# Patient Record
Sex: Female | Born: 1990 | Race: White | Hispanic: No | Marital: Married
Health system: Midwestern US, Academic
[De-identification: ages and names within clinical notes are randomized; demographics above are authoritative.]

---

## 2017-04-11 ENCOUNTER — Ambulatory Visit: Admit: 2017-04-11 | Discharge: 2017-04-12 | Payer: MEDICAID

## 2017-04-11 DIAGNOSIS — O24419 Gestational diabetes mellitus in pregnancy, unspecified control: Principal | ICD-10-CM

## 2017-04-12 ENCOUNTER — Encounter: Admit: 2017-04-12 | Discharge: 2017-04-12

## 2017-04-12 NOTE — Progress Notes
Pt emailed blood sugar readings. Reviewed by Dr. Ann MakiParrish.    Current regimen:    Lantus   55 u q hs    Novolog   20 u with breakfast   20 u with lunch   20 u with dinner    New regimen:      Lantus   60 u q hs    Novolog   20 u with breakfast   20 u with lunch   20 u with dinner    Pt emailed and informed of changes.     GA:  [id number]

## 2017-04-18 ENCOUNTER — Ambulatory Visit: Admit: 2017-04-18 | Discharge: 2017-04-19 | Payer: MEDICAID

## 2017-04-18 DIAGNOSIS — O24419 Gestational diabetes mellitus in pregnancy, unspecified control: Principal | ICD-10-CM

## 2017-04-18 NOTE — Progress Notes
Yvonne OppenheimJulia A Hernandez presents for an ultrasound encounter. Past Medical, Surgical, Family & Social History; Medications & Allergies contained in the electronic record below were not reviewed today and may not be up-to-date. Please see [facility] report for all documentation related to this encounter.    04/18/2017  Yvonne PennyAdriana Jimenez, MA

## 2017-04-26 ENCOUNTER — Encounter: Admit: 2017-04-26 | Discharge: 2017-04-26

## 2017-05-23 NOTE — Progress Notes
Georgeann OppenheimJulia A Hanahan presents for an ultrasound encounter. Past Medical, Surgical, Family & Social History; Medications & Allergies contained in the electronic record below were not reviewed today and may not be up-to-date. Please see [facility] report for all documentation related to this encounter.    05/23/2017  Butch PennyAdriana Jimenez, MA

## 2017-11-20 LAB — CHLAM/NG PCR SWAB
Lab: NEGATIVE
Lab: NEGATIVE

## 2017-12-10 ENCOUNTER — Ambulatory Visit: Admit: 2017-12-10 | Discharge: 2017-12-11 | Payer: Medicaid Other

## 2017-12-10 ENCOUNTER — Encounter: Admit: 2017-12-10 | Discharge: 2017-12-10

## 2017-12-10 DIAGNOSIS — O0991 Supervision of high risk pregnancy, unspecified, first trimester: Principal | ICD-10-CM

## 2017-12-10 DIAGNOSIS — Z3682 Encounter for antenatal screening for nuchal translucency: Principal | ICD-10-CM

## 2017-12-18 ENCOUNTER — Encounter: Admit: 2017-12-18 | Discharge: 2017-12-18

## 2017-12-19 ENCOUNTER — Encounter: Admit: 2017-12-19 | Discharge: 2017-12-19

## 2017-12-19 DIAGNOSIS — Z1379 Encounter for other screening for genetic and chromosomal anomalies: Principal | ICD-10-CM

## 2017-12-26 ENCOUNTER — Encounter: Admit: 2017-12-26 | Discharge: 2017-12-26

## 2017-12-26 DIAGNOSIS — Z1379 Encounter for other screening for genetic and chromosomal anomalies: Principal | ICD-10-CM

## 2018-02-06 ENCOUNTER — Encounter: Admit: 2018-02-06 | Discharge: 2018-02-06

## 2018-02-19 ENCOUNTER — Encounter: Admit: 2018-02-19 | Discharge: 2018-02-19

## 2018-02-27 ENCOUNTER — Ambulatory Visit: Admit: 2018-02-27 | Discharge: 2018-02-28 | Payer: Medicaid Other

## 2018-02-27 ENCOUNTER — Encounter: Admit: 2018-02-27 | Discharge: 2018-02-27

## 2018-02-27 DIAGNOSIS — O24419 Gestational diabetes mellitus in pregnancy, unspecified control: Principal | ICD-10-CM

## 2018-02-27 DIAGNOSIS — O0990 Supervision of high risk pregnancy, unspecified, unspecified trimester: Principal | ICD-10-CM

## 2018-02-27 DIAGNOSIS — O09293 Supervision of pregnancy with other poor reproductive or obstetric history, third trimester: ICD-10-CM

## 2018-02-27 DIAGNOSIS — Z8279 Family history of other congenital malformations, deformations and chromosomal abnormalities: ICD-10-CM

## 2018-02-27 MED ORDER — METFORMIN 1,000 MG PO TAB
1000 mg | ORAL_TABLET | Freq: Two times a day (BID) | ORAL | 3 refills | Status: AC
Start: 2018-02-27 — End: 2018-05-28

## 2018-03-06 ENCOUNTER — Encounter: Admit: 2018-03-06 | Discharge: 2018-03-06

## 2018-03-25 ENCOUNTER — Encounter: Admit: 2018-03-25 | Discharge: 2018-03-25

## 2018-03-25 ENCOUNTER — Ambulatory Visit: Admit: 2018-03-25 | Discharge: 2018-03-26 | Payer: Medicaid Other

## 2018-03-25 DIAGNOSIS — Z3A27 27 weeks gestation of pregnancy: ICD-10-CM

## 2018-03-25 DIAGNOSIS — O24414 Gestational diabetes mellitus in pregnancy, insulin controlled: ICD-10-CM

## 2018-03-25 DIAGNOSIS — A6000 Herpesviral infection of urogenital system, unspecified: ICD-10-CM

## 2018-03-25 DIAGNOSIS — O09522 Supervision of elderly multigravida, second trimester: ICD-10-CM

## 2018-03-25 DIAGNOSIS — O0992 Supervision of high risk pregnancy, unspecified, second trimester: Principal | ICD-10-CM

## 2018-04-01 ENCOUNTER — Ambulatory Visit: Admit: 2018-04-01 | Discharge: 2018-04-02 | Payer: Medicaid Other

## 2018-04-01 ENCOUNTER — Encounter: Admit: 2018-04-01 | Discharge: 2018-04-01

## 2018-04-01 DIAGNOSIS — O0993 Supervision of high risk pregnancy, unspecified, third trimester: Principal | ICD-10-CM

## 2018-04-08 ENCOUNTER — Encounter: Admit: 2018-04-08 | Discharge: 2018-04-08

## 2018-04-08 ENCOUNTER — Ambulatory Visit: Admit: 2018-04-08 | Discharge: 2018-04-09 | Payer: Medicaid Other

## 2018-04-08 DIAGNOSIS — O24419 Gestational diabetes mellitus in pregnancy, unspecified control: ICD-10-CM

## 2018-04-08 DIAGNOSIS — O0993 Supervision of high risk pregnancy, unspecified, third trimester: Principal | ICD-10-CM

## 2018-04-08 DIAGNOSIS — A6000 Herpesviral infection of urogenital system, unspecified: ICD-10-CM

## 2018-04-08 DIAGNOSIS — O09522 Supervision of elderly multigravida, second trimester: ICD-10-CM

## 2018-04-09 ENCOUNTER — Encounter: Admit: 2018-04-09 | Discharge: 2018-04-09

## 2018-04-10 ENCOUNTER — Encounter: Admit: 2018-04-10 | Discharge: 2018-04-10

## 2018-04-11 ENCOUNTER — Encounter: Admit: 2018-04-11 | Discharge: 2018-04-11 | Payer: Medicaid Other

## 2018-04-11 ENCOUNTER — Encounter: Admit: 2018-04-11 | Discharge: 2018-04-11

## 2018-04-11 ENCOUNTER — Inpatient Hospital Stay: Admit: 2018-04-11 | Discharge: 2018-04-11

## 2018-04-11 ENCOUNTER — Ambulatory Visit: Admit: 2018-04-11 | Discharge: 2018-04-14 | Payer: Medicaid Other

## 2018-04-11 DIAGNOSIS — F99 Mental disorder, not otherwise specified: Principal | ICD-10-CM

## 2018-04-11 DIAGNOSIS — O24424 Gestational diabetes mellitus in childbirth, insulin controlled: ICD-10-CM

## 2018-04-11 DIAGNOSIS — O24113 Pre-existing diabetes mellitus, type 2, in pregnancy, third trimester: ICD-10-CM

## 2018-04-11 DIAGNOSIS — O24414 Gestational diabetes mellitus in pregnancy, insulin controlled: Secondary | ICD-10-CM

## 2018-04-11 MED ORDER — AZITHROMYCIN IVPB
1000 mg | Freq: Once | INTRAVENOUS | 0 refills | Status: AC
Start: 2018-04-11 — End: ?

## 2018-04-11 MED ORDER — SODIUM CITRATE-CITRIC ACID 500-334 MG/5 ML PO SOLN
30 mL | Freq: Once | ORAL | 0 refills | Status: CN | PRN
Start: 2018-04-11 — End: ?

## 2018-04-11 MED ORDER — OXYTOCIN IN 0.9 % SOD CHLORIDE 30 UNIT/500 ML IV SOLN
24 [IU] | Freq: Once | INTRAVENOUS | 0 refills | Status: AC
Start: 2018-04-11 — End: ?

## 2018-04-11 MED ORDER — LACTATED RINGERS IV SOLP
INTRAVENOUS | 0 refills | Status: CN
Start: 2018-04-11 — End: ?

## 2018-04-11 MED ORDER — SODIUM CITRATE-CITRIC ACID 500-334 MG/5 ML PO SOLN
30 mL | Freq: Once | ORAL | 0 refills | Status: AC | PRN
Start: 2018-04-11 — End: ?

## 2018-04-11 MED ORDER — MAGNESIUM SULFATE IN WATER 20 GRAM/500 ML (4 %) IV SOLP
2 g/h | INTRAVENOUS | 0 refills | Status: CN
Start: 2018-04-11 — End: ?

## 2018-04-11 MED ORDER — MAGNESIUM BOLUS FOR CONTINUOUS INFUSION
6 g | Freq: Once | INTRAVENOUS | 0 refills | Status: DC
Start: 2018-04-11 — End: 2018-04-12

## 2018-04-11 MED ORDER — BETAMETHASONE ACET,SOD PHOS 6 MG/ML IJ SUSP
12 mg | Freq: Once | INTRAMUSCULAR | 0 refills | Status: CP
Start: 2018-04-11 — End: ?
  Administered 2018-04-12: 12 mg via INTRAMUSCULAR

## 2018-04-11 MED ORDER — AMOXICILLIN 250 MG PO CAP
250 mg | ORAL | 0 refills | Status: DC
Start: 2018-04-11 — End: 2018-04-12

## 2018-04-11 MED ORDER — PENICILLIN G POTASSIUM 5MU/100ML NS IVPB
5 10*6.[IU] | Freq: Once | INTRAVENOUS | 0 refills | Status: CN
Start: 2018-04-11 — End: ?

## 2018-04-11 MED ORDER — AMOXICILLIN 250 MG PO CAP
250 mg | ORAL | 0 refills | Status: DC
Start: 2018-04-11 — End: 2018-04-14

## 2018-04-11 MED ORDER — AMPICILLIN 2G/100ML NS IVPB (MB+)
2 g | INTRAVENOUS | 0 refills | Status: DC
Start: 2018-04-11 — End: 2018-04-12

## 2018-04-11 MED ORDER — AMPICILLIN 2G/100ML NS IVPB (MB+)
2 g | INTRAVENOUS | 0 refills | Status: DC
Start: 2018-04-11 — End: 2018-04-14
  Administered 2018-04-12 – 2018-04-14 (×18): 2 g via INTRAVENOUS

## 2018-04-11 MED ORDER — MISOPROSTOL 200 MCG PO TAB
800 ug | Freq: Once | RECTAL | 0 refills | Status: AC
Start: 2018-04-11 — End: ?

## 2018-04-11 MED ORDER — MAGNESIUM BOLUS FOR CONTINUOUS INFUSION
6 g | Freq: Once | INTRAVENOUS | 0 refills | Status: CN
Start: 2018-04-11 — End: ?

## 2018-04-11 MED ORDER — PENICILLIN G POTASSIUM IVPB
2.5 10*6.[IU] | INTRAVENOUS | 0 refills | Status: CN
Start: 2018-04-11 — End: ?

## 2018-04-11 MED ORDER — MISOPROSTOL 200 MCG PO TAB
800 ug | Freq: Once | RECTAL | 0 refills | Status: CN
Start: 2018-04-11 — End: ?

## 2018-04-11 MED ORDER — LACTATED RINGERS IV SOLP
INTRAVENOUS | 0 refills | Status: DC
Start: 2018-04-11 — End: 2018-04-12
  Administered 2018-04-12 (×2): 1000.000 mL via INTRAVENOUS

## 2018-04-11 MED ORDER — OXYTOCIN IN 0.9 % SOD CHLORIDE 30 UNIT/500 ML IV SOLN
24 [IU] | Freq: Once | INTRAVENOUS | 0 refills | Status: CN
Start: 2018-04-11 — End: ?

## 2018-04-11 MED ORDER — PENICILLIN G POTASSIUM IVPB
2.5 10*6.[IU] | INTRAVENOUS | 0 refills | Status: DC
Start: 2018-04-11 — End: 2018-04-12

## 2018-04-11 MED ORDER — PENICILLIN G POTASSIUM 5MU/100ML NS IVPB
5 10*6.[IU] | Freq: Once | INTRAVENOUS | 0 refills | Status: DC
Start: 2018-04-11 — End: 2018-04-12

## 2018-04-11 MED ORDER — MAGNESIUM SULFATE IN WATER 20 GRAM/500 ML (4 %) IV SOLP
2 g/h | INTRAVENOUS | 0 refills | Status: DC
Start: 2018-04-11 — End: 2018-04-12
  Administered 2018-04-12 (×2): 2 g/h via INTRAVENOUS

## 2018-04-12 ENCOUNTER — Encounter: Admit: 2018-04-12 | Discharge: 2018-04-12

## 2018-04-12 DIAGNOSIS — H579 Unspecified disorder of eye and adnexa: ICD-10-CM

## 2018-04-12 DIAGNOSIS — F99 Mental disorder, not otherwise specified: Principal | ICD-10-CM

## 2018-04-12 LAB — CBC
Lab: 10 g/dL — ABNORMAL LOW (ref 12.0–15.0)
Lab: 11 10*3/uL (ref 4.5–11.0)
Lab: 14 % (ref 11–15)
Lab: 27 pg (ref 26–34)
Lab: 3.9 M/UL — ABNORMAL LOW (ref 4.0–5.0)
Lab: 32 g/dL (ref 32.0–36.0)
Lab: 33 % — ABNORMAL LOW (ref 36–45)
Lab: 372 10*3/uL (ref 150–400)
Lab: 6.5 FL — ABNORMAL LOW (ref 7–11)
Lab: 83 FL (ref 80–100)

## 2018-04-12 LAB — URINALYSIS DIPSTICK
Lab: NEGATIVE
Lab: NEGATIVE
Lab: NEGATIVE

## 2018-04-12 LAB — POC GLUCOSE
Lab: 122 mg/dL — ABNORMAL HIGH (ref 70–100)
Lab: 125 mg/dL — ABNORMAL HIGH (ref 70–100)
Lab: 137 mg/dL — ABNORMAL HIGH (ref 70–100)
Lab: 142 mg/dL — ABNORMAL HIGH (ref 70–100)
Lab: 144 mg/dL — ABNORMAL HIGH (ref 70–100)
Lab: 150 mg/dL — ABNORMAL HIGH (ref 70–100)
Lab: 150 mg/dL — ABNORMAL HIGH (ref 70–100)
Lab: 176 mg/dL — ABNORMAL HIGH (ref 70–100)

## 2018-04-12 LAB — URINALYSIS, MICROSCOPIC

## 2018-04-12 LAB — DIRECT EXAM (WET PREP)

## 2018-04-12 MED ORDER — ACETAMINOPHEN 325 MG PO TAB
650 mg | ORAL | 0 refills | Status: DC | PRN
Start: 2018-04-12 — End: 2018-04-14
  Administered 2018-04-12 – 2018-04-14 (×4): 650 mg via ORAL

## 2018-04-12 MED ORDER — INSULIN ASPART 100 UNIT/ML SC FLEXPEN
9 [IU] | Freq: Every day | SUBCUTANEOUS | 0 refills | Status: DC
Start: 2018-04-12 — End: 2018-04-14
  Administered 2018-04-13: 17:00:00 9 [IU] via SUBCUTANEOUS

## 2018-04-12 MED ORDER — HYDROXYZINE HCL 25 MG PO TAB
25 mg | Freq: Three times a day (TID) | ORAL | 0 refills | Status: DC | PRN
Start: 2018-04-12 — End: 2018-04-12

## 2018-04-12 MED ORDER — NPH INSULIN HUMAN RECOMB 100 UNIT/ML (3 ML) SC PEN
10 [IU] | Freq: Every day | SUBCUTANEOUS | 0 refills | Status: DC
Start: 2018-04-12 — End: 2018-04-12

## 2018-04-12 MED ORDER — METFORMIN 500 MG PO TAB
1000 mg | Freq: Two times a day (BID) | ORAL | 0 refills | Status: DC
Start: 2018-04-12 — End: 2018-04-14
  Administered 2018-04-12 – 2018-04-14 (×4): 1000 mg via ORAL

## 2018-04-12 MED ORDER — ACYCLOVIR 400 MG PO TAB
400 mg | ORAL | 0 refills | Status: DC
Start: 2018-04-12 — End: 2018-04-14
  Administered 2018-04-12 – 2018-04-14 (×8): 400 mg via ORAL

## 2018-04-12 MED ORDER — INSULIN REGULAR HUMAN(#) 1 UNIT/ML IJ SYRINGE
2 [IU] | Freq: Once | INTRAVENOUS | 0 refills | Status: CP
Start: 2018-04-12 — End: ?
  Administered 2018-04-12: 11:00:00 2 [IU] via INTRAVENOUS

## 2018-04-12 MED ORDER — INSULIN REGULAR HUMAN(#) 1 UNIT/ML IJ SYRINGE
1-20 [IU] | INTRAVENOUS | 0 refills | Status: DC
Start: 2018-04-12 — End: 2018-04-14

## 2018-04-12 MED ORDER — INSULIN ASPART 100 UNIT/ML SC FLEXPEN
3 [IU] | Freq: Every day | SUBCUTANEOUS | 0 refills | Status: DC
Start: 2018-04-12 — End: 2018-04-12

## 2018-04-12 MED ORDER — NPH INSULIN HUMAN RECOMB 100 UNIT/ML (3 ML) SC PEN
15 [IU] | Freq: Every day | SUBCUTANEOUS | 0 refills | Status: DC
Start: 2018-04-12 — End: 2018-04-13
  Administered 2018-04-12: 13:00:00 15 [IU] via SUBCUTANEOUS

## 2018-04-12 MED ORDER — INSULIN REGULAR HUMAN 100 UNIT/ML IJ SOLN
1-20 [IU] | SUBCUTANEOUS | 0 refills | Status: DC
Start: 2018-04-12 — End: 2018-04-12

## 2018-04-12 MED ORDER — PROCHLORPERAZINE MALEATE 10 MG PO TAB
10 mg | ORAL | 0 refills | Status: DC | PRN
Start: 2018-04-12 — End: 2018-04-14
  Administered 2018-04-13: 02:00:00 10 mg via ORAL

## 2018-04-12 MED ORDER — INSULIN ASPART 100 UNIT/ML SC FLEXPEN
9 [IU] | Freq: Every day | SUBCUTANEOUS | 0 refills | Status: DC
Start: 2018-04-12 — End: 2018-04-14

## 2018-04-12 MED ORDER — NPH INSULIN HUMAN RECOMB 100 UNIT/ML (3 ML) SC PEN
15 [IU] | Freq: Every evening | SUBCUTANEOUS | 0 refills | Status: DC
Start: 2018-04-12 — End: 2018-04-13

## 2018-04-12 MED ORDER — DIPHENHYDRAMINE HCL 25 MG PO CAP
25-50 mg | ORAL | 0 refills | Status: DC | PRN
Start: 2018-04-12 — End: 2018-04-14
  Administered 2018-04-13: 02:00:00 25 mg via ORAL

## 2018-04-12 MED ORDER — NPH INSULIN HUMAN RECOMB 100 UNIT/ML (3 ML) SC PEN
10 [IU] | Freq: Every evening | SUBCUTANEOUS | 0 refills | Status: DC
Start: 2018-04-12 — End: 2018-04-12

## 2018-04-13 LAB — CULTURE-STREP SCREEN: Lab: NEGATIVE

## 2018-04-13 LAB — POC GLUCOSE
Lab: 155 mg/dL — ABNORMAL HIGH (ref 70–100)
Lab: 169 mg/dL — ABNORMAL HIGH (ref 70–100)
Lab: 120 mg/dL — ABNORMAL HIGH (ref 70–100)
Lab: 110 mg/dL — ABNORMAL HIGH (ref 70–100)
Lab: 106 mg/dL — ABNORMAL HIGH (ref 70–100)

## 2018-04-13 LAB — CULTURE-URINE W/SENSITIVITY

## 2018-04-13 MED ORDER — NPH INSULIN HUMAN RECOMB 100 UNIT/ML (3 ML) SC PEN
20 [IU] | Freq: Every evening | SUBCUTANEOUS | 0 refills | Status: DC
Start: 2018-04-13 — End: 2018-04-14

## 2018-04-13 MED ORDER — NPH INSULIN HUMAN RECOMB 100 UNIT/ML (3 ML) SC PEN
20 [IU] | Freq: Every day | SUBCUTANEOUS | 0 refills | Status: DC
Start: 2018-04-13 — End: 2018-04-14

## 2018-04-14 ENCOUNTER — Inpatient Hospital Stay
Admit: 2018-04-12 | Discharge: 2018-04-14 | Disposition: A | Discharge status: Home / Self Care | Payer: Medicaid Other | Attending: Maternal & Fetal Medicine | Admitting: Maternal & Fetal Medicine

## 2018-04-14 ENCOUNTER — Encounter: Admit: 2018-04-14 | Discharge: 2018-04-14

## 2018-04-14 DIAGNOSIS — O24414 Gestational diabetes mellitus in pregnancy, insulin controlled: ICD-10-CM

## 2018-04-14 DIAGNOSIS — Z3A30 30 weeks gestation of pregnancy: ICD-10-CM

## 2018-04-14 DIAGNOSIS — F99 Mental disorder, not otherwise specified: Principal | ICD-10-CM

## 2018-04-14 DIAGNOSIS — Z9114 Patient's other noncompliance with medication regimen: ICD-10-CM

## 2018-04-14 DIAGNOSIS — O4703 False labor before 37 completed weeks of gestation, third trimester: Principal | ICD-10-CM

## 2018-04-14 DIAGNOSIS — H579 Unspecified disorder of eye and adnexa: ICD-10-CM

## 2018-04-14 LAB — POC GLUCOSE
Lab: 105 mg/dL — ABNORMAL HIGH (ref 70–100)
Lab: 109 mg/dL — ABNORMAL HIGH (ref 70–100)
Lab: 114 mg/dL — ABNORMAL HIGH (ref 70–100)
Lab: 118 mg/dL — ABNORMAL HIGH (ref 70–100)
Lab: 122 mg/dL — ABNORMAL HIGH (ref >60)
Lab: 87 mg/dL (ref 70–100)
Lab: 91 mg/dL (ref 70–100)

## 2018-04-14 LAB — CBC AND DIFF
Lab: 0 % (ref 0–2)
Lab: 0 % (ref 0–5)
Lab: 0 10*3/uL (ref 0–0.20)
Lab: 0 10*3/uL (ref 0–0.45)
Lab: 0.7 10*3/uL (ref 0–0.80)
Lab: 1.6 10*3/uL (ref 1.0–4.8)
Lab: 10 10*3/uL (ref 4.5–11.0)
Lab: 14 % (ref 11–15)
Lab: 17 % — ABNORMAL LOW (ref 24–44)
Lab: 26 pg (ref 26–34)
Lab: 3.6 M/UL — ABNORMAL LOW (ref 4.0–5.0)
Lab: 31 % — ABNORMAL LOW (ref 36–45)
Lab: 31 g/dL — ABNORMAL LOW (ref 32.0–36.0)
Lab: 354 10*3/uL (ref 150–400)
Lab: 6.3 FL — ABNORMAL LOW (ref 7–11)
Lab: 7 % (ref 4–12)
Lab: 7.6 10*3/uL — ABNORMAL HIGH (ref 1.8–7.0)
Lab: 76 % (ref 41–77)

## 2018-04-14 LAB — CHLAM/NG PCR SWAB
Lab: NEGATIVE
Lab: NEGATIVE

## 2018-04-14 LAB — AMNISURE ROM: Lab: NEGATIVE

## 2018-04-14 MED ORDER — NPH INSULIN HUMAN RECOMB 100 UNIT/ML (3 ML) SC PEN
24 [IU] | Freq: Every evening | SUBCUTANEOUS | 0 refills | Status: DC
Start: 2018-04-14 — End: 2018-04-14

## 2018-04-14 MED ORDER — INSULIN ASPART 100 UNIT/ML SC FLEXPEN
9 [IU] | Freq: Every day | SUBCUTANEOUS | 3 refills | Status: SS
Start: 2018-04-14 — End: 2018-05-05

## 2018-04-14 MED ORDER — NPH INSULIN HUMAN RECOMB 100 UNIT/ML (3 ML) SC PEN
24 [IU] | Freq: Every day | SUBCUTANEOUS | 3 refills | 32.00000 days | Status: AC
Start: 2018-04-14 — End: 2018-05-05

## 2018-04-14 MED ORDER — INSULIN ASPART 100 UNIT/ML SC FLEXPEN
9 [IU] | Freq: Every day | SUBCUTANEOUS | 3 refills | 30.00000 days | Status: AC
Start: 2018-04-14 — End: 2018-05-05

## 2018-04-14 MED ORDER — ACYCLOVIR 400 MG PO TAB
400 mg | ORAL_TABLET | ORAL | 0 refills | Status: SS
Start: 2018-04-14 — End: 2018-06-02

## 2018-04-14 MED ORDER — ACETAMINOPHEN 325 MG PO TAB
650 mg | ORAL | 0 refills | Status: AC | PRN
Start: 2018-04-14 — End: 2018-06-04

## 2018-04-14 MED ORDER — SODIUM CHLORIDE 0.9 % FLUSH
3-5 mL | Freq: Three times a day (TID) | 0 refills | Status: DC
Start: 2018-04-14 — End: 2018-04-14

## 2018-04-14 MED ORDER — NPH INSULIN HUMAN RECOMB 100 UNIT/ML (3 ML) SC PEN
24 [IU] | Freq: Every evening | SUBCUTANEOUS | 3 refills | Status: SS
Start: 2018-04-14 — End: 2018-05-05

## 2018-04-14 MED ORDER — NPH INSULIN HUMAN RECOMB 100 UNIT/ML (3 ML) SC PEN
24 [IU] | Freq: Every day | SUBCUTANEOUS | 0 refills | Status: DC
Start: 2018-04-14 — End: 2018-04-14

## 2018-04-17 ENCOUNTER — Encounter: Admit: 2018-04-17 | Discharge: 2018-04-17

## 2018-04-21 ENCOUNTER — Encounter: Admit: 2018-04-21 | Discharge: 2018-04-21

## 2018-04-21 DIAGNOSIS — Z3A30 30 weeks gestation of pregnancy: Principal | ICD-10-CM

## 2018-04-29 ENCOUNTER — Encounter: Admit: 2018-04-29 | Discharge: 2018-04-29

## 2018-04-29 ENCOUNTER — Ambulatory Visit: Admit: 2018-04-29 | Discharge: 2018-04-30 | Payer: Medicaid Other

## 2018-04-29 DIAGNOSIS — F99 Mental disorder, not otherwise specified: Principal | ICD-10-CM

## 2018-04-29 DIAGNOSIS — O24414 Gestational diabetes mellitus in pregnancy, insulin controlled: ICD-10-CM

## 2018-04-29 DIAGNOSIS — O24113 Pre-existing diabetes mellitus, type 2, in pregnancy, third trimester: Principal | ICD-10-CM

## 2018-04-29 DIAGNOSIS — Z8279 Family history of other congenital malformations, deformations and chromosomal abnormalities: ICD-10-CM

## 2018-04-29 DIAGNOSIS — H579 Unspecified disorder of eye and adnexa: ICD-10-CM

## 2018-04-29 DIAGNOSIS — O09293 Supervision of pregnancy with other poor reproductive or obstetric history, third trimester: ICD-10-CM

## 2018-04-29 DIAGNOSIS — O24913 Unspecified diabetes mellitus in pregnancy, third trimester: Principal | ICD-10-CM

## 2018-05-03 ENCOUNTER — Encounter: Admit: 2018-05-03 | Discharge: 2018-05-03

## 2018-05-03 ENCOUNTER — Inpatient Hospital Stay: Admit: 2018-05-03 | Discharge: 2018-05-03

## 2018-05-03 DIAGNOSIS — O24919 Unspecified diabetes mellitus in pregnancy, unspecified trimester: ICD-10-CM

## 2018-05-03 DIAGNOSIS — F99 Mental disorder, not otherwise specified: Principal | ICD-10-CM

## 2018-05-03 DIAGNOSIS — O24414 Gestational diabetes mellitus in pregnancy, insulin controlled: ICD-10-CM

## 2018-05-03 DIAGNOSIS — H579 Unspecified disorder of eye and adnexa: ICD-10-CM

## 2018-05-03 DIAGNOSIS — O09293 Supervision of pregnancy with other poor reproductive or obstetric history, third trimester: ICD-10-CM

## 2018-05-03 MED ORDER — LACTATED RINGERS IV SOLP
1000 mL | INTRAVENOUS | 0 refills | Status: CP
Start: 2018-05-03 — End: ?
  Administered 2018-05-04: 03:00:00 1000 mL via INTRAVENOUS

## 2018-05-03 MED ORDER — SODIUM CITRATE-CITRIC ACID 500-334 MG/5 ML PO SOLN
30 mL | Freq: Once | ORAL | 0 refills | Status: AC | PRN
Start: 2018-05-03 — End: ?

## 2018-05-03 MED ORDER — LACTATED RINGERS IV SOLP
INTRAVENOUS | 0 refills | Status: DC
Start: 2018-05-03 — End: 2018-05-05
  Administered 2018-05-04 – 2018-05-05 (×5): 1000.000 mL via INTRAVENOUS

## 2018-05-03 MED ORDER — INSULIN REGULAR HUMAN(#) 1 UNIT/ML IJ SYRINGE
1-20 [IU] | INTRAVENOUS | 0 refills | Status: DC
Start: 2018-05-03 — End: 2018-05-05

## 2018-05-03 MED ORDER — NPH INSULIN HUMAN RECOMB 100 UNIT/ML (3 ML) SC PEN
24 [IU] | Freq: Every evening | SUBCUTANEOUS | 0 refills | Status: DC
Start: 2018-05-03 — End: 2018-05-05
  Administered 2018-05-04: 05:00:00 24 [IU] via SUBCUTANEOUS

## 2018-05-03 MED ORDER — NPH INSULIN HUMAN RECOMB 100 UNIT/ML (3 ML) SC PEN
24 [IU] | Freq: Every day | SUBCUTANEOUS | 0 refills | Status: DC
Start: 2018-05-03 — End: 2018-05-05

## 2018-05-03 MED ORDER — INSULIN ASPART 100 UNIT/ML SC FLEXPEN
9 [IU] | Freq: Every day | SUBCUTANEOUS | 0 refills | Status: DC
Start: 2018-05-03 — End: 2018-05-05
  Administered 2018-05-04: 17:00:00 9 [IU] via SUBCUTANEOUS

## 2018-05-03 MED ORDER — INSULIN ASPART 100 UNIT/ML SC FLEXPEN
9 [IU] | Freq: Every day | SUBCUTANEOUS | 0 refills | Status: DC
Start: 2018-05-03 — End: 2018-05-05

## 2018-05-03 MED ORDER — OXYTOCIN IN 0.9 % SOD CHLORIDE 30 UNIT/500 ML IV SOLN
24 [IU] | Freq: Once | INTRAVENOUS | 0 refills | Status: AC
Start: 2018-05-03 — End: ?

## 2018-05-03 MED ORDER — MISOPROSTOL 200 MCG PO TAB
800 ug | Freq: Once | RECTAL | 0 refills | Status: AC
Start: 2018-05-03 — End: ?

## 2018-05-04 ENCOUNTER — Ambulatory Visit: Admit: 2018-05-04 | Discharge: 2018-05-04 | Payer: Medicaid Other

## 2018-05-04 ENCOUNTER — Inpatient Hospital Stay
Admit: 2018-05-04 | Discharge: 2018-05-05 | Disposition: A | Discharge status: Home / Self Care | Payer: Medicaid Other | Attending: Maternal & Fetal Medicine

## 2018-05-04 LAB — COMPREHENSIVE METABOLIC PANEL
Lab: 0.5 mg/dL (ref 0.4–1.00)
Lab: 113 U/L — ABNORMAL HIGH (ref 25–110)
Lab: 12 U/L (ref 7–40)
Lab: 12 mg/dL (ref 7–25)
Lab: 136 MMOL/L — ABNORMAL LOW (ref 137–147)
Lab: 160 mg/dL — ABNORMAL HIGH (ref 70–100)
Lab: 21 MMOL/L (ref 21–30)
Lab: 3.2 g/dL — ABNORMAL LOW (ref 3.5–5.0)
Lab: 3.9 MMOL/L (ref 3.5–5.1)
Lab: 6.8 g/dL (ref 6.0–8.0)
Lab: 7 U/L (ref 7–56)
Lab: 8 (ref 3–12)
Lab: 9 mg/dL (ref 8.5–10.6)
Lab: >60 mL/min (ref >60)
Lab: >60 mL/min (ref >60)

## 2018-05-04 LAB — POC GLUCOSE
Lab: 152 mg/dL — ABNORMAL HIGH (ref 70–100)
Lab: 121 mg/dL — ABNORMAL HIGH (ref 70–100)
Lab: 88 mg/dL (ref 70–100)
Lab: 88 mg/dL (ref 70–100)
Lab: 65 mg/dL — ABNORMAL LOW (ref 70–100)
Lab: 72 mg/dL (ref 70–100)

## 2018-05-04 LAB — URINALYSIS DIPSTICK POC
Lab: 5.5 (ref 5.0–8.0)
Lab: NEGATIVE

## 2018-05-04 LAB — DIRECT EXAM (WET PREP)

## 2018-05-04 LAB — CBC
Lab: 15 % — ABNORMAL HIGH (ref 11–15)
Lab: 25 pg — ABNORMAL LOW (ref 26–34)
Lab: 3.8 M/UL — ABNORMAL LOW (ref 4.0–5.0)
Lab: 31 g/dL — ABNORMAL LOW (ref 32.0–36.0)
Lab: 7.9 10*3/uL (ref 4.5–11.0)
Lab: 79 FL — ABNORMAL LOW (ref 80–100)
Lab: 9.8 g/dL — ABNORMAL LOW (ref 12.0–15.0)

## 2018-05-04 LAB — SYPHILIS AB SCREEN: Lab: NEGATIVE % — ABNORMAL LOW (ref 36–45)

## 2018-05-04 MED ORDER — LACTATED RINGERS IV SOLP
INTRAVENOUS | 0 refills | Status: CP
Start: 2018-05-04 — End: ?
  Administered 2018-05-05: 03:00:00 1000.000 mL via INTRAVENOUS

## 2018-05-04 MED ORDER — METFORMIN 500 MG PO TAB
1000 mg | Freq: Two times a day (BID) | ORAL | 0 refills | Status: DC
Start: 2018-05-04 — End: 2018-05-05
  Administered 2018-05-04 – 2018-05-05 (×3): 1000 mg via ORAL

## 2018-05-05 ENCOUNTER — Inpatient Hospital Stay: Admit: 2018-05-03 | Discharge: 2018-05-03

## 2018-05-05 ENCOUNTER — Encounter: Admit: 2018-05-05 | Discharge: 2018-05-05

## 2018-05-05 DIAGNOSIS — E119 Type 2 diabetes mellitus without complications: ICD-10-CM

## 2018-05-05 DIAGNOSIS — O24113 Pre-existing diabetes mellitus, type 2, in pregnancy, third trimester: Principal | ICD-10-CM

## 2018-05-05 DIAGNOSIS — O98313 Other infections with a predominantly sexual mode of transmission complicating pregnancy, third trimester: ICD-10-CM

## 2018-05-05 DIAGNOSIS — Z794 Long term (current) use of insulin: ICD-10-CM

## 2018-05-05 DIAGNOSIS — Z9114 Patient's other noncompliance with medication regimen: ICD-10-CM

## 2018-05-05 DIAGNOSIS — Z148 Genetic carrier of other disease: ICD-10-CM

## 2018-05-05 DIAGNOSIS — O368130 Decreased fetal movements, third trimester, not applicable or unspecified: ICD-10-CM

## 2018-05-05 DIAGNOSIS — Z3A32 32 weeks gestation of pregnancy: ICD-10-CM

## 2018-05-05 LAB — POC GLUCOSE
Lab: 85 mg/dL (ref 70–100)
Lab: 95 mg/dL (ref 70–100)
Lab: 84 mg/dL — ABNORMAL HIGH (ref 70–100)
Lab: 90 mg/dL (ref 70–100)
Lab: 80 mg/dL (ref 70–100)
Lab: 72 mg/dL — ABNORMAL LOW (ref 70–100)
Lab: 67 mg/dL — ABNORMAL LOW (ref 70–100)

## 2018-05-05 LAB — CHLAM/NG PCR URINE
Lab: NEGATIVE
Lab: NEGATIVE FL — ABNORMAL LOW (ref 7–11)

## 2018-05-05 MED ORDER — INSULIN ASPART 100 UNIT/ML SC FLEXPEN
3 refills | 30.00000 days | Status: AC
Start: 2018-05-05 — End: 2018-05-28

## 2018-05-05 MED ORDER — PEN NEEDLE, DIABETIC 32 GAUGE X 5/32" MISC NDLE
1 | 3 refills | 30.00000 days | Status: AC | PRN
Start: 2018-05-05 — End: 2018-06-04

## 2018-05-05 MED ORDER — LANCETS 33 GAUGE MISC MISC
1 | Freq: Before meals | 11 refills | Status: AC
Start: 2018-05-05 — End: 2018-06-04
  Filled 2018-05-05: qty 300, 25d supply

## 2018-05-05 MED ORDER — BLOOD SUGAR DIAGNOSTIC MISC STRP
1 | ORAL_STRIP | Freq: Before meals | 3 refills | 30.00000 days | Status: AC
Start: 2018-05-05 — End: 2018-06-04
  Filled 2018-05-05 (×2): qty 300, 25d supply

## 2018-05-05 MED ORDER — NPH INSULIN HUMAN RECOMB 100 UNIT/ML (3 ML) SC PEN
3 refills | 32.00000 days | Status: AC
Start: 2018-05-05 — End: 2018-05-28

## 2018-05-05 MED ORDER — BLOOD-GLUCOSE METER MISC MISC
1 | PACK | 0 refills | 50.00000 days | Status: AC | PRN
Start: 2018-05-05 — End: 2018-06-04
  Filled 2018-05-05: qty 1, 14d supply

## 2018-05-06 ENCOUNTER — Encounter: Admit: 2018-05-06 | Discharge: 2018-05-06

## 2018-05-06 ENCOUNTER — Ambulatory Visit: Admit: 2018-05-06 | Discharge: 2018-05-07 | Payer: Medicaid Other

## 2018-05-06 DIAGNOSIS — O24913 Unspecified diabetes mellitus in pregnancy, third trimester: Principal | ICD-10-CM

## 2018-05-06 DIAGNOSIS — O09522 Supervision of elderly multigravida, second trimester: ICD-10-CM

## 2018-05-06 DIAGNOSIS — Z8279 Family history of other congenital malformations, deformations and chromosomal abnormalities: ICD-10-CM

## 2018-05-06 DIAGNOSIS — O0993 Supervision of high risk pregnancy, unspecified, third trimester: ICD-10-CM

## 2018-05-06 DIAGNOSIS — H579 Unspecified disorder of eye and adnexa: ICD-10-CM

## 2018-05-06 DIAGNOSIS — O24414 Gestational diabetes mellitus in pregnancy, insulin controlled: ICD-10-CM

## 2018-05-06 DIAGNOSIS — A6000 Herpesviral infection of urogenital system, unspecified: ICD-10-CM

## 2018-05-06 DIAGNOSIS — F99 Mental disorder, not otherwise specified: Principal | ICD-10-CM

## 2018-05-06 DIAGNOSIS — O09293 Supervision of pregnancy with other poor reproductive or obstetric history, third trimester: ICD-10-CM

## 2018-05-06 DIAGNOSIS — O99210 Obesity complicating pregnancy, unspecified trimester: Principal | ICD-10-CM

## 2018-05-07 ENCOUNTER — Encounter: Admit: 2018-05-07 | Discharge: 2018-05-07

## 2018-05-12 ENCOUNTER — Encounter: Admit: 2018-05-12 | Discharge: 2018-05-12

## 2018-05-13 ENCOUNTER — Encounter: Admit: 2018-05-13 | Discharge: 2018-05-13

## 2018-05-13 ENCOUNTER — Ambulatory Visit: Admit: 2018-05-13 | Discharge: 2018-05-14 | Payer: Medicaid Other

## 2018-05-13 DIAGNOSIS — O24113 Pre-existing diabetes mellitus, type 2, in pregnancy, third trimester: Principal | ICD-10-CM

## 2018-05-13 DIAGNOSIS — O09293 Supervision of pregnancy with other poor reproductive or obstetric history, third trimester: ICD-10-CM

## 2018-05-13 DIAGNOSIS — O133 Gestational [pregnancy-induced] hypertension without significant proteinuria, third trimester: ICD-10-CM

## 2018-05-13 DIAGNOSIS — H579 Unspecified disorder of eye and adnexa: ICD-10-CM

## 2018-05-13 DIAGNOSIS — F99 Mental disorder, not otherwise specified: Principal | ICD-10-CM

## 2018-05-13 DIAGNOSIS — O24414 Gestational diabetes mellitus in pregnancy, insulin controlled: ICD-10-CM

## 2018-05-13 DIAGNOSIS — Z8279 Family history of other congenital malformations, deformations and chromosomal abnormalities: ICD-10-CM

## 2018-05-15 ENCOUNTER — Encounter: Admit: 2018-05-15 | Discharge: 2018-05-15

## 2018-05-16 ENCOUNTER — Encounter: Admit: 2018-05-16 | Discharge: 2018-05-16

## 2018-05-16 DIAGNOSIS — O133 Gestational [pregnancy-induced] hypertension without significant proteinuria, third trimester: ICD-10-CM

## 2018-05-16 DIAGNOSIS — O99210 Obesity complicating pregnancy, unspecified trimester: ICD-10-CM

## 2018-05-16 DIAGNOSIS — Z8279 Family history of other congenital malformations, deformations and chromosomal abnormalities: ICD-10-CM

## 2018-05-16 DIAGNOSIS — O24113 Pre-existing diabetes mellitus, type 2, in pregnancy, third trimester: Principal | ICD-10-CM

## 2018-05-20 ENCOUNTER — Encounter: Admit: 2018-05-20 | Discharge: 2018-05-20

## 2018-05-20 ENCOUNTER — Encounter: Admit: 2018-05-20 | Discharge: 2018-05-20 | Payer: Medicaid Other

## 2018-05-20 DIAGNOSIS — F99 Mental disorder, not otherwise specified: ICD-10-CM

## 2018-05-20 DIAGNOSIS — E7872 Smith-Lemli-Opitz syndrome: ICD-10-CM

## 2018-05-20 DIAGNOSIS — O0993 Supervision of high risk pregnancy, unspecified, third trimester: Principal | ICD-10-CM

## 2018-05-20 DIAGNOSIS — O24414 Gestational diabetes mellitus in pregnancy, insulin controlled: ICD-10-CM

## 2018-05-20 DIAGNOSIS — H579 Unspecified disorder of eye and adnexa: ICD-10-CM

## 2018-05-20 DIAGNOSIS — Z8279 Family history of other congenital malformations, deformations and chromosomal abnormalities: Secondary | ICD-10-CM

## 2018-05-21 ENCOUNTER — Ambulatory Visit: Admit: 2018-05-20 | Discharge: 2018-05-20

## 2018-05-21 ENCOUNTER — Ambulatory Visit: Admit: 2018-05-20 | Discharge: 2018-05-21 | Payer: Medicaid Other

## 2018-05-21 DIAGNOSIS — O09293 Supervision of pregnancy with other poor reproductive or obstetric history, third trimester: ICD-10-CM

## 2018-05-21 DIAGNOSIS — O133 Gestational [pregnancy-induced] hypertension without significant proteinuria, third trimester: ICD-10-CM

## 2018-05-21 DIAGNOSIS — Z3A35 35 weeks gestation of pregnancy: ICD-10-CM

## 2018-05-21 DIAGNOSIS — O99210 Obesity complicating pregnancy, unspecified trimester: ICD-10-CM

## 2018-05-21 DIAGNOSIS — A6000 Herpesviral infection of urogenital system, unspecified: Secondary | ICD-10-CM

## 2018-05-21 DIAGNOSIS — O24113 Pre-existing diabetes mellitus, type 2, in pregnancy, third trimester: Principal | ICD-10-CM

## 2018-05-21 DIAGNOSIS — O24414 Gestational diabetes mellitus in pregnancy, insulin controlled: ICD-10-CM

## 2018-05-21 DIAGNOSIS — Z8279 Family history of other congenital malformations, deformations and chromosomal abnormalities: ICD-10-CM

## 2018-05-21 DIAGNOSIS — O0993 Supervision of high risk pregnancy, unspecified, third trimester: ICD-10-CM

## 2018-05-22 ENCOUNTER — Inpatient Hospital Stay: Admit: 2018-05-22 | Discharge: 2018-05-22

## 2018-05-22 ENCOUNTER — Encounter: Admit: 2018-05-22 | Discharge: 2018-05-22

## 2018-05-22 DIAGNOSIS — O24414 Gestational diabetes mellitus in pregnancy, insulin controlled: ICD-10-CM

## 2018-05-22 DIAGNOSIS — Z3483 Encounter for supervision of other normal pregnancy, third trimester: ICD-10-CM

## 2018-05-22 DIAGNOSIS — F99 Mental disorder, not otherwise specified: Principal | ICD-10-CM

## 2018-05-22 DIAGNOSIS — H579 Unspecified disorder of eye and adnexa: ICD-10-CM

## 2018-05-22 LAB — CULTURE-STREP SCREEN

## 2018-05-22 MED ORDER — INSULIN ASPART 100 UNIT/ML SC FLEXPEN
13 [IU] | Freq: Every day | SUBCUTANEOUS | 0 refills | Status: DC
Start: 2018-05-22 — End: 2018-05-25

## 2018-05-22 MED ORDER — INSULIN ASPART 100 UNIT/ML SC FLEXPEN
13 [IU] | Freq: Every day | SUBCUTANEOUS | 0 refills | Status: DC
Start: 2018-05-22 — End: 2018-05-28

## 2018-05-22 MED ORDER — ACYCLOVIR 400 MG PO TAB
400 mg | ORAL | 0 refills | Status: DC
Start: 2018-05-22 — End: 2018-05-28
  Administered 2018-05-23 – 2018-05-28 (×17): 400 mg via ORAL

## 2018-05-22 MED ORDER — NPH INSULIN HUMAN RECOMB 100 UNIT/ML (3 ML) SC PEN
29 [IU] | Freq: Every day | SUBCUTANEOUS | 0 refills | Status: DC
Start: 2018-05-22 — End: 2018-05-27

## 2018-05-22 MED ORDER — ACYCLOVIR 400 MG PO TAB
400 mg | ORAL | 0 refills | Status: DC
Start: 2018-05-22 — End: 2018-05-23

## 2018-05-22 MED ORDER — NPH INSULIN HUMAN RECOMB 100 UNIT/ML (3 ML) SC PEN
29 [IU] | Freq: Every evening | SUBCUTANEOUS | 0 refills | Status: DC
Start: 2018-05-22 — End: 2018-05-23
  Administered 2018-05-23: 05:00:00 29 [IU] via SUBCUTANEOUS

## 2018-05-22 MED ORDER — INSULIN ASPART 100 UNIT/ML SC FLEXPEN
10 [IU] | Freq: Every day | SUBCUTANEOUS | 0 refills | Status: DC
Start: 2018-05-22 — End: 2018-05-28
  Administered 2018-05-23: 04:00:00 10 [IU] via SUBCUTANEOUS

## 2018-05-23 LAB — CBC
Lab: 10 10*3/uL (ref 4.5–11.0)
Lab: 15 % — ABNORMAL HIGH (ref 11–15)
Lab: 24 pg — ABNORMAL LOW (ref 26–34)
Lab: 291 10*3/uL (ref 150–400)
Lab: 3.9 M/UL — ABNORMAL LOW (ref 4.0–5.0)
Lab: 32 g/dL (ref 32.0–36.0)
Lab: 75 FL — ABNORMAL LOW (ref 80–100)
Lab: 9.7 g/dL — ABNORMAL LOW (ref 12.0–15.0)

## 2018-05-23 LAB — POC GLUCOSE
Lab: 111 mg/dL — ABNORMAL HIGH (ref 70–100)
Lab: 132 mg/dL — ABNORMAL HIGH (ref 70–100)
Lab: 107 mg/dL — ABNORMAL HIGH (ref 70–100)
Lab: 98 mg/dL (ref 70–100)
Lab: 78 mg/dL (ref 70–100)
Lab: 85 mg/dL (ref 70–100)
Lab: 78 mg/dL (ref 70–100)
Lab: 77 mg/dL (ref 70–100)

## 2018-05-23 LAB — URINALYSIS DIPSTICK POC
Lab: 6 (ref 5.0–8.0)
Lab: >=1 (ref 1.003–1.035)
Lab: NEGATIVE
Lab: NEGATIVE
Lab: NEGATIVE
Lab: NEGATIVE

## 2018-05-23 LAB — URIC ACID: Lab: 5.5 mg/dL — ABNORMAL HIGH (ref 2.0–7.0)

## 2018-05-23 LAB — FREE T4 (FREE THYROXINE) ONLY: Lab: 0.8 ng/dL — AB (ref 0.6–1.6)

## 2018-05-23 LAB — HEMOGLOBIN A1C: Lab: 5.8 % (ref 4.0–6.0)

## 2018-05-23 LAB — SYPHILIS AB SCREEN: Lab: NEGATIVE % — ABNORMAL LOW (ref 36–45)

## 2018-05-23 LAB — URINALYSIS DIPSTICK
Lab: NEGATIVE (ref 3–12)
Lab: NEGATIVE mL/min
Lab: NEGATIVE mL/min

## 2018-05-23 LAB — COMPREHENSIVE METABOLIC PANEL: Lab: 134 MMOL/L — ABNORMAL LOW (ref 137–147)

## 2018-05-23 LAB — THYROID STIMULATING HORMONE-TSH: Lab: 0.3 uU/mL — ABNORMAL LOW (ref 0.35–5.00)

## 2018-05-23 LAB — LDH-LACTATE DEHYDROGENASE: Lab: 217 U/L — ABNORMAL HIGH (ref 100–210)

## 2018-05-23 LAB — PROTEIN/CR RATIO,UR RAN: Lab: 22 mg/dL (ref 98–110)

## 2018-05-23 LAB — URINALYSIS, MICROSCOPIC

## 2018-05-23 MED ORDER — FERROUS SULFATE 325 MG (65 MG IRON) PO TAB
325 mg | Freq: Three times a day (TID) | ORAL | 0 refills | Status: DC
Start: 2018-05-23 — End: 2018-05-28
  Administered 2018-05-23 – 2018-05-28 (×12): 325 mg via ORAL

## 2018-05-23 MED ORDER — DOCUSATE SODIUM 100 MG PO CAP
100 mg | Freq: Two times a day (BID) | ORAL | 0 refills | Status: DC
Start: 2018-05-23 — End: 2018-05-28
  Administered 2018-05-24 – 2018-05-28 (×10): 100 mg via ORAL

## 2018-05-23 MED ORDER — FAMOTIDINE (PF) 20 MG/2 ML IV SOLN
20 mg | Freq: Two times a day (BID) | INTRAVENOUS | 0 refills | Status: DC
Start: 2018-05-23 — End: 2018-05-27
  Administered 2018-05-25: 16:00:00 20 mg via INTRAVENOUS

## 2018-05-23 MED ORDER — DIPHENHYDRAMINE HCL 25 MG PO CAP
25-50 mg | ORAL | 0 refills | Status: DC | PRN
Start: 2018-05-23 — End: 2018-05-28

## 2018-05-23 MED ORDER — ACETAMINOPHEN 325 MG PO TAB
650 mg | ORAL | 0 refills | Status: DC | PRN
Start: 2018-05-23 — End: 2018-05-28
  Administered 2018-05-25 (×2): 650 mg via ORAL

## 2018-05-23 MED ORDER — NPH INSULIN HUMAN RECOMB 100 UNIT/ML (3 ML) SC PEN
31 [IU] | Freq: Every evening | SUBCUTANEOUS | 0 refills | Status: DC
Start: 2018-05-23 — End: 2018-05-25

## 2018-05-23 MED ORDER — POLYETHYLENE GLYCOL 3350 17 GRAM PO PWPK
1 | Freq: Every day | ORAL | 0 refills | Status: DC
Start: 2018-05-23 — End: 2018-05-27

## 2018-05-23 MED ORDER — ONDANSETRON HCL (PF) 4 MG/2 ML IJ SOLN
4 mg | INTRAVENOUS | 0 refills | Status: DC | PRN
Start: 2018-05-23 — End: 2018-05-27

## 2018-05-24 LAB — CREATININE CLEARANCE-URINE 24H: Lab: 97 mg/dL — ABNORMAL HIGH (ref 50–150)

## 2018-05-24 LAB — URINE COLLECTION
Lab: 107 mL — AB (ref 0–5)
Lab: 24 (ref 5.0–8.0)

## 2018-05-24 LAB — POC GLUCOSE
Lab: 98 mg/dL (ref 70–100)
Lab: 75 mg/dL (ref 70–100)
Lab: 74 mg/dL (ref 70–100)
Lab: 73 mg/dL (ref 70–100)
Lab: 73 mg/dL (ref 70–100)
Lab: 58 mg/dL — ABNORMAL LOW (ref 70–100)
Lab: 85 mg/dL (ref 70–100)
Lab: 70 mg/dL (ref 70–100)

## 2018-05-24 LAB — TOTAL PROTEIN-URINE 24 HR: Lab: 18 mg/dL — ABNORMAL LOW (ref 3.5–5.0)

## 2018-05-24 LAB — CULTURE-URINE W/SENSITIVITY

## 2018-05-25 LAB — POC GLUCOSE
Lab: 71 mg/dL (ref 70–100)
Lab: 65 mg/dL — ABNORMAL LOW (ref 70–100)
Lab: 66 mg/dL — ABNORMAL LOW (ref 70–100)
Lab: 68 mg/dL — ABNORMAL LOW (ref 70–100)
Lab: 84 mg/dL (ref 70–100)
Lab: 77 mg/dL (ref 70–100)
Lab: 91 mg/dL (ref 70–100)

## 2018-05-25 MED ORDER — LACTATED RINGERS IV SOLP
500 mL | INTRAVENOUS | 0 refills | Status: CP
Start: 2018-05-25 — End: ?

## 2018-05-25 MED ORDER — ENOXAPARIN 40 MG/0.4 ML SC SYRG
40 mg | Freq: Two times a day (BID) | SUBCUTANEOUS | 0 refills | Status: DC
Start: 2018-05-25 — End: 2018-05-25

## 2018-05-25 MED ORDER — NPH INSULIN HUMAN RECOMB 100 UNIT/ML (3 ML) SC PEN
30 [IU] | Freq: Every evening | SUBCUTANEOUS | 0 refills | Status: DC
Start: 2018-05-25 — End: 2018-05-27

## 2018-05-25 MED ORDER — INSULIN ASPART 100 UNIT/ML SC FLEXPEN
11 [IU] | Freq: Every day | SUBCUTANEOUS | 0 refills | Status: DC
Start: 2018-05-25 — End: 2018-05-28

## 2018-05-25 MED ORDER — DIPHENHYDRAMINE HCL 25 MG PO CAP
25-50 mg | Freq: Once | ORAL | 0 refills | Status: CP
Start: 2018-05-25 — End: ?
  Administered 2018-05-25: 17:00:00 25 mg via ORAL

## 2018-05-25 MED ORDER — ENOXAPARIN 40 MG/0.4 ML SC SYRG
40 mg | Freq: Every day | SUBCUTANEOUS | 0 refills | Status: DC
Start: 2018-05-25 — End: 2018-05-28
  Administered 2018-05-26 – 2018-05-28 (×3): 40 mg via SUBCUTANEOUS

## 2018-05-25 MED ADMIN — LACTATED RINGERS IV SOLP [4318]: 500 mL | INTRAVENOUS | @ 21:00:00 | Stop: 2018-05-25 | NDC 00338011704

## 2018-05-26 LAB — POC GLUCOSE
Lab: 97 mg/dL (ref 70–100)
Lab: 76 mg/dL (ref 70–100)
Lab: 62 mg/dL — ABNORMAL LOW (ref 70–100)
Lab: 66 mg/dL — ABNORMAL LOW (ref 70–100)
Lab: 70 mg/dL (ref 70–100)
Lab: 66 mg/dL — ABNORMAL LOW (ref 70–100)
Lab: 57 mg/dL — ABNORMAL LOW (ref 70–100)
Lab: 86 mg/dL (ref 70–100)
Lab: 72 mg/dL (ref 70–100)

## 2018-05-26 LAB — CBC
Lab: 15 % — ABNORMAL HIGH (ref 11–15)
Lab: 25 pg — ABNORMAL LOW (ref 26–34)
Lab: 29 % — ABNORMAL LOW (ref 36–45)
Lab: 3.9 M/UL — ABNORMAL LOW (ref 4.0–5.0)
Lab: 324 10*3/uL (ref 150–400)
Lab: 33 g/dL (ref 32.0–36.0)
Lab: 7.7 10*3/uL (ref 4.5–11.0)
Lab: 9.9 g/dL — ABNORMAL LOW (ref 12.0–15.0)

## 2018-05-26 MED ORDER — DIPHENHYDRAMINE HCL 50 MG/ML IJ SOLN
25 mg | INTRAVENOUS | 0 refills | Status: DC | PRN
Start: 2018-05-26 — End: 2018-05-28

## 2018-05-26 MED ORDER — IRON SUCROSE 300 MG IRON/15 ML IV SOLN
300 mg | Freq: Once | INTRAVENOUS | 0 refills | Status: CP
Start: 2018-05-26 — End: ?
  Administered 2018-05-26 (×2): 300 mg via INTRAVENOUS

## 2018-05-26 MED ORDER — EPINEPHRINE HCL (PF) 1 MG/ML (1 ML) IJ SOLN
.3-.5 mg | INTRAMUSCULAR | 0 refills | Status: DC | PRN
Start: 2018-05-26 — End: 2018-05-28

## 2018-05-27 LAB — POC GLUCOSE
Lab: 86 mg/dL — ABNORMAL HIGH (ref 70–100)
Lab: 73 mg/dL (ref 70–100)
Lab: 59 mg/dL — ABNORMAL LOW (ref 70–100)
Lab: 64 mg/dL — ABNORMAL LOW (ref 70–100)
Lab: 67 mg/dL — ABNORMAL LOW (ref 70–100)
Lab: 62 mg/dL — ABNORMAL LOW (ref 70–100)
Lab: 74 mg/dL (ref 70–100)
Lab: 119 mg/dL — ABNORMAL HIGH (ref 70–100)
Lab: 74 mg/dL — ABNORMAL HIGH (ref 70–100)

## 2018-05-27 MED ORDER — FAMOTIDINE (PF) 20 MG/2 ML IV SOLN
20 mg | Freq: Two times a day (BID) | INTRAVENOUS | 0 refills | Status: DC | PRN
Start: 2018-05-27 — End: 2018-05-28

## 2018-05-27 MED ORDER — NPH INSULIN HUMAN RECOMB 100 UNIT/ML (3 ML) SC PEN
28 [IU] | Freq: Every evening | SUBCUTANEOUS | 0 refills | Status: DC
Start: 2018-05-27 — End: 2018-05-28

## 2018-05-27 MED ORDER — POLYETHYLENE GLYCOL 3350 17 GRAM PO PWPK
1 | Freq: Every day | ORAL | 0 refills | Status: DC | PRN
Start: 2018-05-27 — End: 2018-05-28

## 2018-05-27 MED ORDER — SODIUM CHLORIDE 0.9 % FLUSH
3-5 mL | Freq: Three times a day (TID) | 0 refills | Status: DC
Start: 2018-05-27 — End: 2018-05-28

## 2018-05-27 MED ORDER — NPH INSULIN HUMAN RECOMB 100 UNIT/ML (3 ML) SC PEN
27 [IU] | Freq: Every day | SUBCUTANEOUS | 0 refills | Status: DC
Start: 2018-05-27 — End: 2018-05-28
  Administered 2018-05-27: 15:00:00 27 [IU] via SUBCUTANEOUS

## 2018-05-27 MED ORDER — METOCLOPRAMIDE HCL 5 MG/ML IJ SOLN
10 mg | INTRAVENOUS | 0 refills | Status: DC | PRN
Start: 2018-05-27 — End: 2018-05-28
  Administered 2018-05-27: 23:00:00 10 mg via INTRAVENOUS

## 2018-05-27 MED ORDER — IRON SUCROSE 300 MG IRON/15 ML IV SOLN
300 mg | Freq: Once | INTRAVENOUS | 0 refills | Status: CP
Start: 2018-05-27 — End: ?
  Administered 2018-05-27 (×2): 300 mg via INTRAVENOUS

## 2018-05-27 MED ORDER — PRENATAL VIT-IRON FUM-FOLIC AC 28 MG IRON- 800 MCG PO TAB
1 | Freq: Every day | ORAL | 0 refills | Status: DC
Start: 2018-05-27 — End: 2018-05-28
  Administered 2018-05-27 – 2018-05-28 (×2): 1 via ORAL

## 2018-05-27 MED ORDER — METOCLOPRAMIDE HCL 5 MG/ML IJ SOLN
10 mg | INTRAVENOUS | 0 refills | Status: DC
Start: 2018-05-27 — End: 2018-05-27

## 2018-05-28 ENCOUNTER — Inpatient Hospital Stay: Admit: 2018-05-22 | Discharge: 2018-05-28 | Disposition: A | Discharge status: Home / Self Care | Payer: Medicaid Other

## 2018-05-28 ENCOUNTER — Inpatient Hospital Stay: Admit: 2018-06-01 | Discharge: 2018-06-01

## 2018-05-28 DIAGNOSIS — O99824 Streptococcus B carrier state complicating childbirth: ICD-10-CM

## 2018-05-28 DIAGNOSIS — Z9119 Patient's noncompliance with other medical treatment and regimen: ICD-10-CM

## 2018-05-28 DIAGNOSIS — O24424 Gestational diabetes mellitus in childbirth, insulin controlled: Principal | ICD-10-CM

## 2018-05-28 DIAGNOSIS — E11649 Type 2 diabetes mellitus with hypoglycemia without coma: ICD-10-CM

## 2018-05-28 DIAGNOSIS — O98513 Other viral diseases complicating pregnancy, third trimester: ICD-10-CM

## 2018-05-28 DIAGNOSIS — B009 Herpesviral infection, unspecified: ICD-10-CM

## 2018-05-28 DIAGNOSIS — O133 Gestational [pregnancy-induced] hypertension without significant proteinuria, third trimester: ICD-10-CM

## 2018-05-28 DIAGNOSIS — Z794 Long term (current) use of insulin: ICD-10-CM

## 2018-05-28 DIAGNOSIS — Z3A35 35 weeks gestation of pregnancy: ICD-10-CM

## 2018-05-28 DIAGNOSIS — O24113 Pre-existing diabetes mellitus, type 2, in pregnancy, third trimester: Principal | ICD-10-CM

## 2018-05-28 LAB — POC GLUCOSE
Lab: 74 mg/dL (ref 70–100)
Lab: 88 mg/dL (ref 70–100)
Lab: 73 mg/dL (ref 70–100)
Lab: 77 mg/dL (ref 70–100)

## 2018-05-28 MED ORDER — INSULIN ASPART 100 UNIT/ML SC FLEXPEN
3 refills | 42.00000 days | Status: AC
Start: 2018-05-28 — End: 2018-06-04

## 2018-05-28 MED ORDER — INSULIN ASPART 100 UNIT/ML SC FLEXPEN
10 [IU] | Freq: Every day | SUBCUTANEOUS | 3 refills | 42.00000 days | Status: AC
Start: 2018-05-28 — End: 2018-05-28

## 2018-05-28 MED ORDER — NPH INSULIN HUMAN RECOMB 100 UNIT/ML (3 ML) SC PEN
28 [IU] | Freq: Every evening | SUBCUTANEOUS | 3 refills | 32.00000 days | Status: AC
Start: 2018-05-28 — End: 2018-05-28

## 2018-05-28 MED ORDER — INSULIN ASPART 100 UNIT/ML SC FLEXPEN
11 [IU] | Freq: Every day | SUBCUTANEOUS | 3 refills | 42.00000 days | Status: AC
Start: 2018-05-28 — End: 2018-05-28

## 2018-05-28 MED ORDER — INSULIN ASPART 100 UNIT/ML SC FLEXPEN
13 [IU] | Freq: Every day | SUBCUTANEOUS | 3 refills | 42.00000 days | Status: AC
Start: 2018-05-28 — End: 2018-05-28

## 2018-05-28 MED ORDER — NPH INSULIN HUMAN RECOMB 100 UNIT/ML (3 ML) SC PEN
27 [IU] | Freq: Every day | SUBCUTANEOUS | 3 refills | 32.00000 days | Status: AC
Start: 2018-05-28 — End: 2018-05-28

## 2018-05-28 MED ORDER — NPH INSULIN HUMAN RECOMB 100 UNIT/ML (3 ML) SC PEN
3 refills | 32.00000 days | Status: AC
Start: 2018-05-28 — End: 2018-06-04

## 2018-05-28 MED ORDER — FERROUS SULFATE 325 MG (65 MG IRON) PO TAB
325 mg | ORAL_TABLET | Freq: Three times a day (TID) | ORAL | 3 refills | Status: SS
Start: 2018-05-28 — End: 2018-06-02

## 2018-05-29 ENCOUNTER — Encounter: Admit: 2018-05-29 | Discharge: 2018-05-29

## 2018-05-30 ENCOUNTER — Encounter: Admit: 2018-05-30 | Discharge: 2018-05-30

## 2018-06-01 ENCOUNTER — Inpatient Hospital Stay: Admit: 2018-06-01 | Discharge: 2018-06-01

## 2018-06-01 ENCOUNTER — Encounter: Admit: 2018-06-01 | Discharge: 2018-06-01

## 2018-06-01 DIAGNOSIS — O24414 Gestational diabetes mellitus in pregnancy, insulin controlled: ICD-10-CM

## 2018-06-01 DIAGNOSIS — F99 Mental disorder, not otherwise specified: Principal | ICD-10-CM

## 2018-06-01 DIAGNOSIS — H579 Unspecified disorder of eye and adnexa: ICD-10-CM

## 2018-06-01 LAB — POC GLUCOSE
Lab: 93 mg/dL (ref 70–100)
Lab: 82 mg/dL (ref 70–100)
Lab: 74 mg/dL (ref 70–100)
Lab: 75 mg/dL (ref 70–100)
Lab: 74 mg/dL (ref 70–100)

## 2018-06-01 LAB — URINALYSIS DIPSTICK POC
Lab: 1 (ref 1.003–1.035)
Lab: 7 (ref 5.0–8.0)
Lab: NEGATIVE
Lab: NEGATIVE
Lab: NEGATIVE
Lab: NEGATIVE
Lab: NEGATIVE
Lab: NEGATIVE

## 2018-06-01 LAB — CBC
Lab: 268 10*3/uL (ref 150–400)
Lab: 6.6 FL — ABNORMAL LOW (ref 7–11)
Lab: 8.6 10*3/uL (ref 4.5–11.0)

## 2018-06-01 LAB — SYPHILIS AB SCREEN: Lab: NEGATIVE mmHg — ABNORMAL LOW (ref 36–45)

## 2018-06-01 MED ORDER — OXYTOCIN IN 0.9 % SOD CHLORIDE 30 UNIT/500 ML IV SOLN
30 [IU] | Freq: Once | INTRAVENOUS | 0 refills | Status: AC | PRN
Start: 2018-06-01 — End: ?

## 2018-06-01 MED ORDER — ACETAMINOPHEN 325 MG PO TAB
650 mg | ORAL | 0 refills | Status: DC | PRN
Start: 2018-06-01 — End: 2018-06-04
  Administered 2018-06-02 – 2018-06-04 (×11): 650 mg via ORAL

## 2018-06-01 MED ORDER — LIDOCAINE-EPINEPHRINE (PF) 1.5 %-1:200,000 IJ SOLN (OR)
0 refills | Status: DC
Start: 2018-06-01 — End: 2018-06-02

## 2018-06-01 MED ORDER — SODIUM CITRATE-CITRIC ACID 500-334 MG/5 ML PO SOLN
30 mL | Freq: Once | ORAL | 0 refills | Status: DC | PRN
Start: 2018-06-01 — End: 2018-06-02

## 2018-06-01 MED ORDER — MISOPROSTOL  25 MCG PO TAB
25 ug | VAGINAL | 0 refills | Status: DC
Start: 2018-06-01 — End: 2018-06-01

## 2018-06-01 MED ORDER — ONDANSETRON HCL (PF) 4 MG/2 ML IJ SOLN
4 mg | INTRAVENOUS | 0 refills | Status: DC | PRN
Start: 2018-06-01 — End: 2018-06-04
  Administered 2018-06-02: 03:00:00 4 mg via INTRAVENOUS

## 2018-06-01 MED ORDER — PENICILLIN G POTASSIUM IVPB
2.5 10*6.[IU] | INTRAVENOUS | 0 refills | Status: DC
Start: 2018-06-01 — End: 2018-06-02
  Administered 2018-06-01 – 2018-06-02 (×6): 2.5 10*6.[IU] via INTRAVENOUS

## 2018-06-01 MED ORDER — PENICILLIN G POTASSIUM 5MU/100ML NS IVPB
5 10*6.[IU] | Freq: Once | INTRAVENOUS | 0 refills | Status: CP
Start: 2018-06-01 — End: ?
  Administered 2018-06-01 (×2): 5 10*6.[IU] via INTRAVENOUS

## 2018-06-01 MED ORDER — MAGNESIUM HYDROXIDE 2,400 MG/10 ML PO SUSP
10 mL | ORAL | 0 refills | Status: DC | PRN
Start: 2018-06-01 — End: 2018-06-04

## 2018-06-01 MED ORDER — FENTANYL/BUPIVACAINE 2 MCG/ML-0.1% PCA EPIDURAL SYRINGE
EPIDURAL | 0 refills | Status: DC
Start: 2018-06-01 — End: 2018-06-02
  Administered 2018-06-01 – 2018-06-02 (×2): 50.000 mL via EPIDURAL

## 2018-06-01 MED ORDER — OXYTOCIN IN 0.9 % SOD CHLORIDE 30 UNIT/500 ML IV SOLN
24 [IU] | Freq: Once | INTRAVENOUS | 0 refills | Status: DC
Start: 2018-06-01 — End: 2018-06-02

## 2018-06-01 MED ORDER — DOCUSATE SODIUM 100 MG PO CAP
100 mg | Freq: Two times a day (BID) | ORAL | 0 refills | Status: DC
Start: 2018-06-01 — End: 2018-06-04
  Administered 2018-06-02 – 2018-06-04 (×6): 100 mg via ORAL

## 2018-06-01 MED ORDER — FAMOTIDINE 40 MG/5 ML (8 MG/ML) PO SUSP
20 mg | Freq: Two times a day (BID) | ORAL | 0 refills | Status: DC
Start: 2018-06-01 — End: 2018-06-02

## 2018-06-01 MED ORDER — LANOLIN TP CREA
TOPICAL | 0 refills | Status: DC | PRN
Start: 2018-06-01 — End: 2018-06-04

## 2018-06-01 MED ORDER — DIPHTH,PERTUS(ACELL),TETANUS 2.5-8-5 LF-MCG-LF/0.5ML IM SUSP
.5 mL | Freq: Once | INTRAMUSCULAR | 0 refills | Status: DC
Start: 2018-06-01 — End: 2018-06-02

## 2018-06-01 MED ORDER — MISOPROSTOL 200 MCG PO TAB
800 ug | Freq: Once | RECTAL | 0 refills | Status: CP
Start: 2018-06-01 — End: ?
  Administered 2018-06-02: 02:00:00 800 ug via RECTAL

## 2018-06-01 MED ORDER — ACYCLOVIR 400 MG PO TAB
400 mg | ORAL | 0 refills | Status: DC
Start: 2018-06-01 — End: 2018-06-02

## 2018-06-01 MED ORDER — NALOXONE 0.4 MG/ML IJ SOLN
.08 mg | INTRAVENOUS | 0 refills | Status: DC | PRN
Start: 2018-06-01 — End: 2018-06-02

## 2018-06-01 MED ORDER — METFORMIN 500 MG PO TAB
500 mg | Freq: Two times a day (BID) | ORAL | 0 refills | Status: DC
Start: 2018-06-01 — End: 2018-06-04
  Administered 2018-06-02 – 2018-06-04 (×5): 500 mg via ORAL

## 2018-06-01 MED ORDER — LACTATED RINGERS IV SOLP
INTRAVENOUS | 0 refills | Status: DC
Start: 2018-06-01 — End: 2018-06-02
  Administered 2018-06-01: 21:00:00 1000.000 mL via INTRAVENOUS

## 2018-06-01 MED ORDER — DEXTROSE 5%-0.9% SODIUM CHLORIDE IV SOLP
INTRAVENOUS | 0 refills | Status: DC
Start: 2018-06-01 — End: 2018-06-01

## 2018-06-01 MED ORDER — SIMETHICONE 80 MG PO CHEW
80 mg | ORAL | 0 refills | Status: DC | PRN
Start: 2018-06-01 — End: 2018-06-04

## 2018-06-01 MED ORDER — IBUPROFEN 600 MG PO TAB
600 mg | ORAL | 0 refills | Status: DC
Start: 2018-06-01 — End: 2018-06-04
  Administered 2018-06-02 – 2018-06-04 (×10): 600 mg via ORAL

## 2018-06-01 MED ORDER — OXYTOCIN IN 0.9 % SOD CHLORIDE 30 UNIT/500 ML IV SOLN
.5-20 m[IU]/min | INTRAVENOUS | 0 refills | Status: DC
Start: 2018-06-01 — End: 2018-06-02
  Administered 2018-06-01: 21:00:00 2 m[IU]/min via INTRAVENOUS

## 2018-06-01 MED ADMIN — LACTATED RINGERS IV SOLP [4318]: INTRAVENOUS | @ 13:00:00 | Stop: 2018-06-01 | NDC 00338011704

## 2018-06-01 MED ADMIN — MISOPROSTOL  25 MCG PO TAB [210540]: 25 ug | VAGINAL | @ 16:00:00 | Stop: 2018-06-01 | NDC 54029101309

## 2018-06-02 LAB — BLOOD GASES-CORD BLOOD
Lab: 1.3 MMOL/L — ABNORMAL LOW (ref 26–34)
Lab: 44 mmHg — ABNORMAL LOW (ref 80–100)
Lab: 7.3 M/UL (ref 4.0–5.0)
Lab: 7.4 mmHg — ABNORMAL LOW (ref 12.0–15.0)
Lab: 87 % (ref 32.0–36.0)

## 2018-06-02 LAB — POC GLUCOSE
Lab: 105 mg/dL — ABNORMAL HIGH (ref 70–100)
Lab: 78 mg/dL (ref 70–100)
Lab: 92 mg/dL (ref 70–100)
Lab: 93 mg/dL — ABNORMAL HIGH (ref >60)
Lab: 94 mg/dL (ref 70–100)
Lab: 97 mg/dL (ref 70–100)

## 2018-06-02 LAB — CBC: Lab: 7.9 10*3/uL — ABNORMAL LOW (ref >60)

## 2018-06-03 ENCOUNTER — Encounter: Admit: 2018-06-03 | Discharge: 2018-06-03

## 2018-06-03 LAB — POC GLUCOSE
Lab: 142 mg/dL — ABNORMAL HIGH (ref 70–100)
Lab: 99 mg/dL (ref 70–100)
Lab: 105 mg/dL — ABNORMAL HIGH (ref 70–100)
Lab: 94 mg/dL (ref 70–100)

## 2018-06-04 ENCOUNTER — Inpatient Hospital Stay: Admit: 2018-06-01 | Discharge: 2018-06-04 | Disposition: A | Discharge status: Home / Self Care | Payer: Medicaid Other

## 2018-06-04 DIAGNOSIS — O24424 Gestational diabetes mellitus in childbirth, insulin controlled: Principal | ICD-10-CM

## 2018-06-04 DIAGNOSIS — O99214 Obesity complicating childbirth: ICD-10-CM

## 2018-06-04 DIAGNOSIS — O134 Gestational [pregnancy-induced] hypertension without significant proteinuria, complicating childbirth: ICD-10-CM

## 2018-06-04 DIAGNOSIS — E669 Obesity, unspecified: ICD-10-CM

## 2018-06-04 DIAGNOSIS — Q112 Microphthalmos: ICD-10-CM

## 2018-06-04 DIAGNOSIS — Z3A37 37 weeks gestation of pregnancy: ICD-10-CM

## 2018-06-04 DIAGNOSIS — O99824 Streptococcus B carrier state complicating childbirth: ICD-10-CM

## 2018-06-04 LAB — POC GLUCOSE
Lab: 98 mg/dL — ABNORMAL LOW (ref >60)
Lab: 91 mg/dL (ref 70–100)

## 2018-06-04 MED ORDER — MEDROXYPROGESTERONE 150 MG/ML IM SYRG
150 mg | Freq: Once | INTRAMUSCULAR | 0 refills | Status: CP
Start: 2018-06-04 — End: ?
  Administered 2018-06-04: 15:00:00 150 mg via INTRAMUSCULAR

## 2018-06-04 MED ORDER — ACETAMINOPHEN 325 MG PO TAB
650 mg | ORAL_TABLET | ORAL | 0 refills | Status: AC | PRN
Start: 2018-06-04 — End: ?

## 2018-06-04 MED ORDER — METFORMIN 500 MG PO TAB
500 mg | ORAL_TABLET | Freq: Two times a day (BID) | ORAL | 3 refills | Status: AC
Start: 2018-06-04 — End: ?

## 2018-06-04 MED ORDER — LANOLIN TP CREA
TOPICAL | 1 refills | Status: AC | PRN
Start: 2018-06-04 — End: ?

## 2018-06-04 MED ORDER — IBUPROFEN 600 MG PO TAB
600 mg | ORAL_TABLET | ORAL | 0 refills | Status: AC
Start: 2018-06-04 — End: ?

## 2018-06-04 MED ORDER — DOCUSATE SODIUM 100 MG PO CAP
100 mg | ORAL_CAPSULE | Freq: Two times a day (BID) | ORAL | 3 refills | Status: AC
Start: 2018-06-04 — End: ?

## 2018-06-16 ENCOUNTER — Encounter: Admit: 2018-06-16 | Discharge: 2018-06-16

## 2018-07-29 ENCOUNTER — Encounter: Admit: 2018-07-29 | Discharge: 2018-07-29

## 2020-02-28 IMAGING — US OBBPPWONST
1 series · 13 of 23 positions shown · non-contrast
Comparison: none

[Series 1: us ob fetal bpp wo non-stress · 23 acquisitions, 13 frames shown]
[im 1/23]
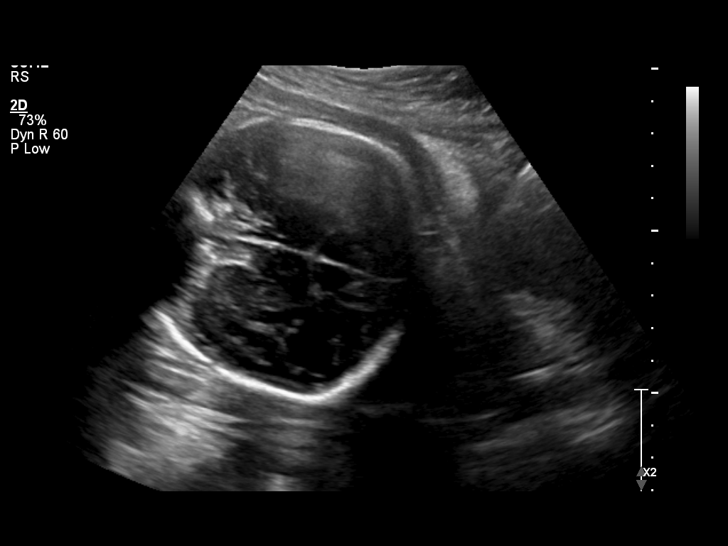
[im 3/23]
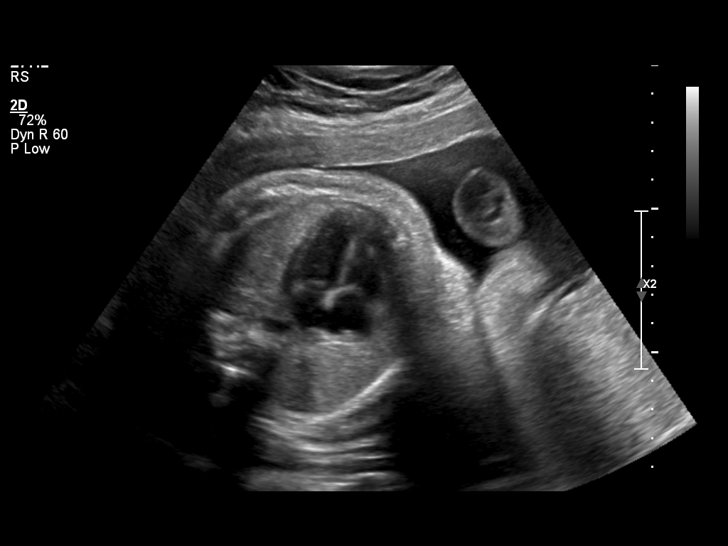
[im 5/23]
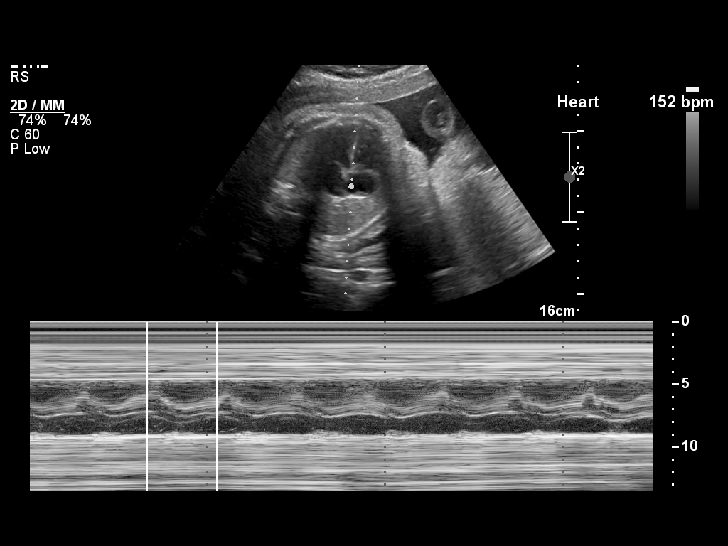
[im 7/23]
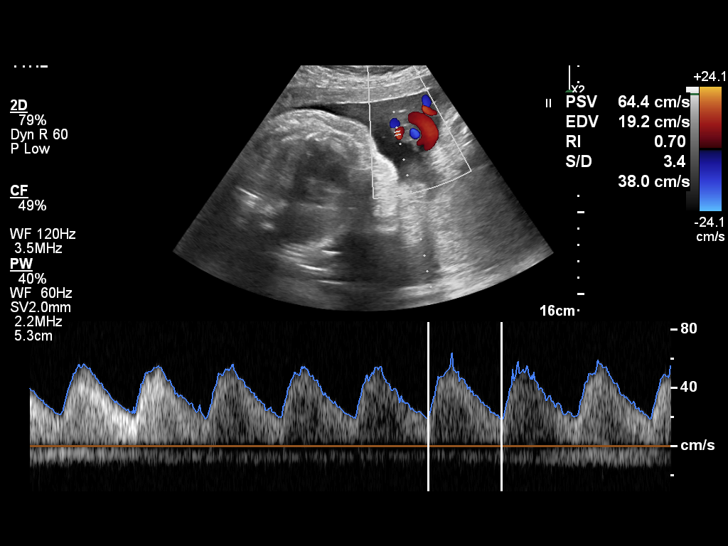
[im 8/23]
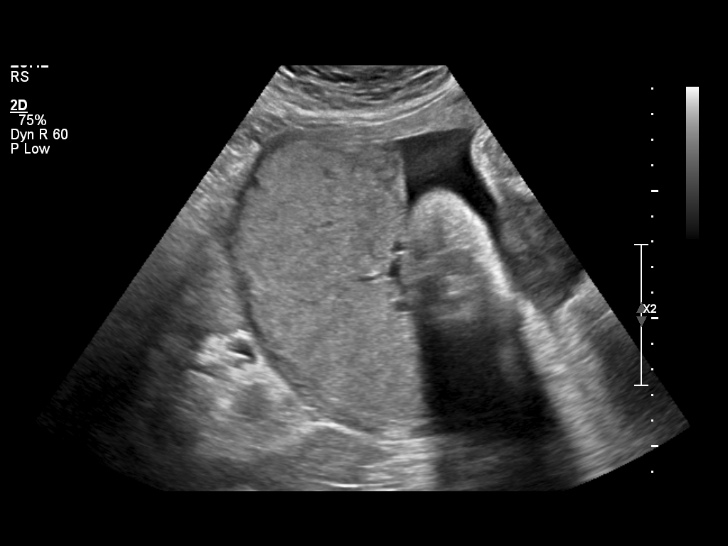
[im 10/23]
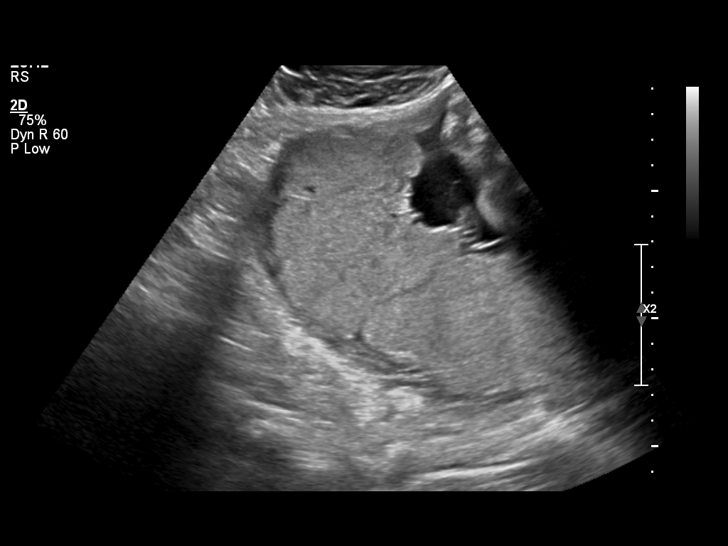
[im 12/23]
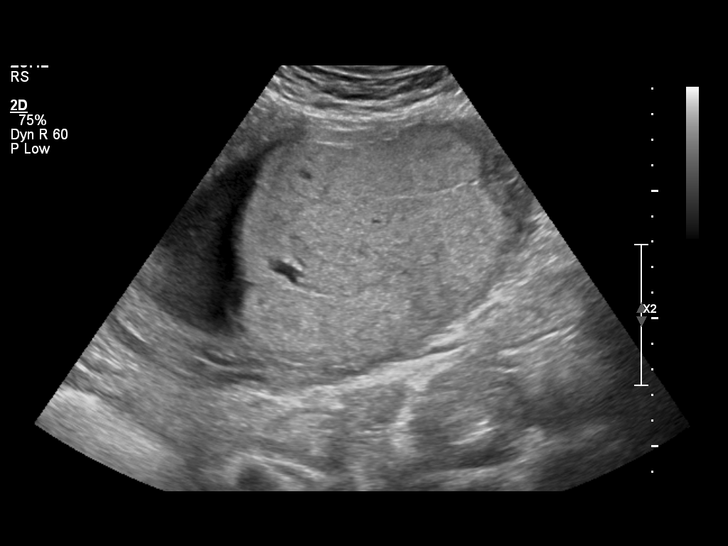
[im 14/23]
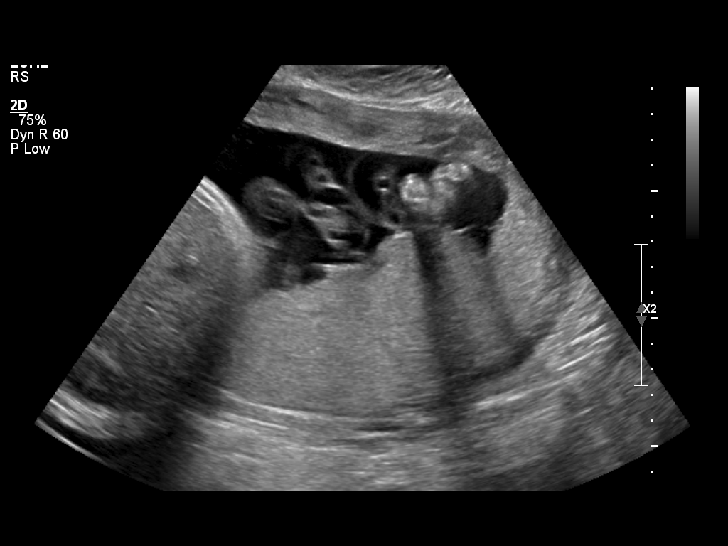
[im 16/23]
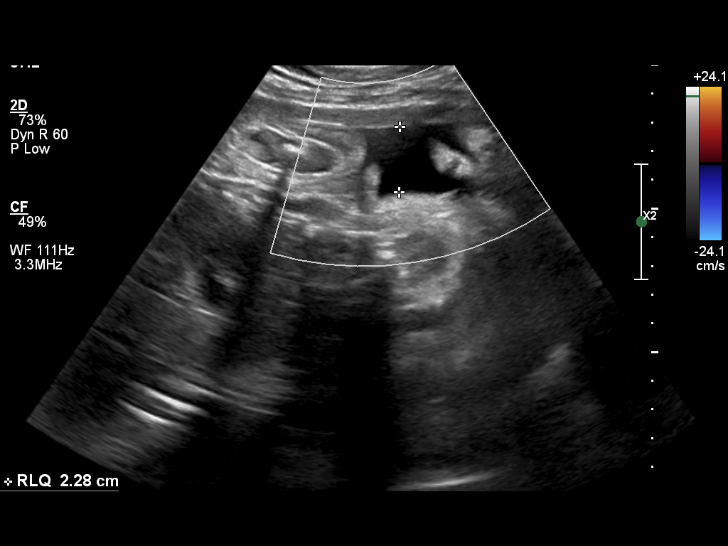
[im 17/23]
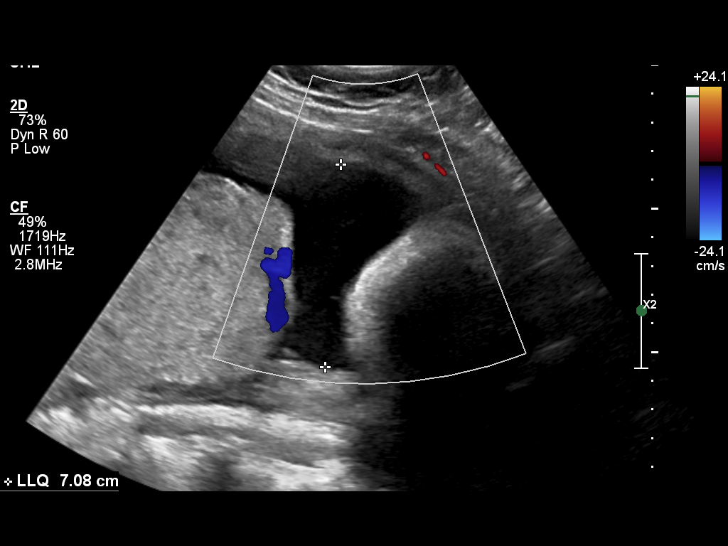
[im 19/23]
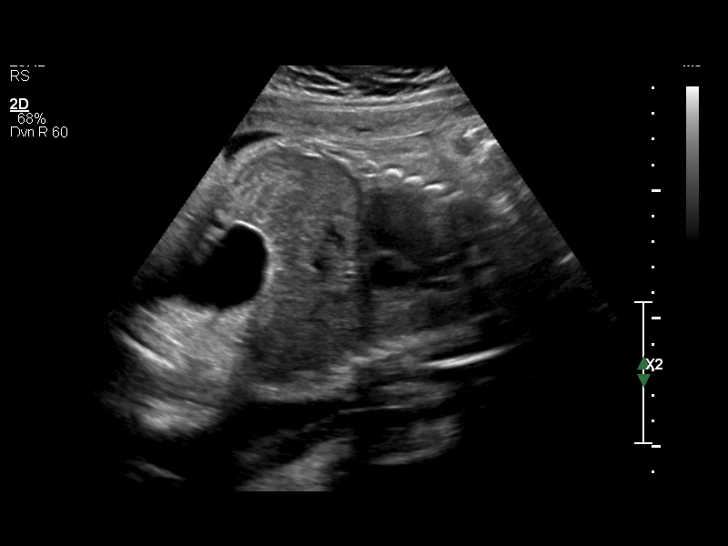
[im 21/23]
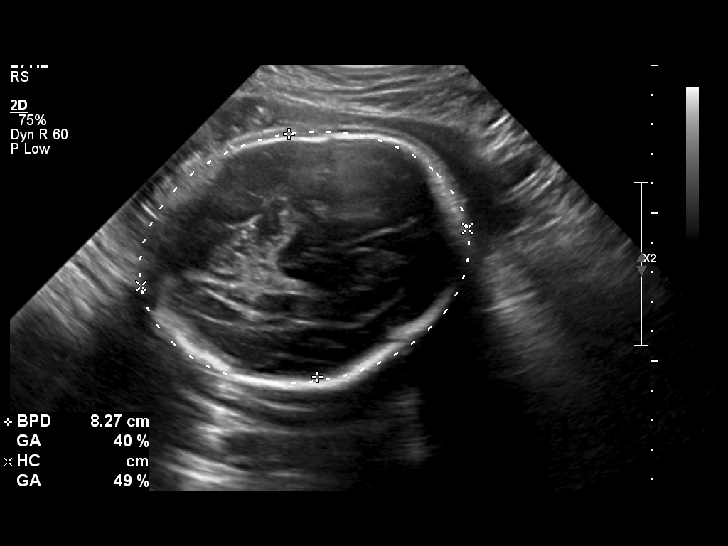
[im 23/23]
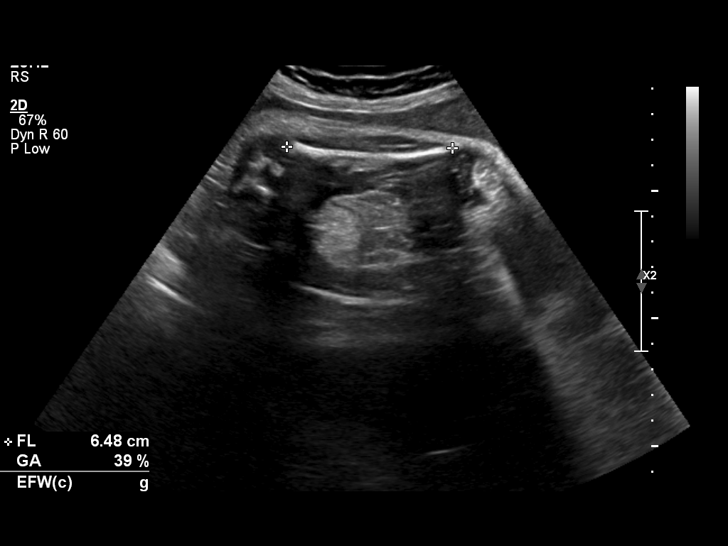

[13 of 23 positions shown; findings below may reference images not displayed]

EXAM
Biophysical profile OB ultrasound; limited OB ultrasound.

INDICATION
GDM
GDM;

FINDINGS
BIOPHYSICAL PROFILE ULTRASOUND:
Normal fetal breathing, fetal tone, gross body movement, and qualitative AFI. The total biophysical
profile score is [DATE]. The amniotic fluid index measures 17.4 centimeters. Fetal heart motion is
documented at 152 beats per minute. Cephalic presentation of the single live intrauterine pregnancy.
The placenta is well away from the cervix. The cord systolic to diastolic ratio measures 3.4.
LIMITED OB ULTRASOUND:
Single live intrauterine pregnancy in cephalic presentation. The placenta is well away from the
cervix. Fetal cardiac motion is documented at 152 beats per minute. Four-chamber heart is
identified. Three-vessel cord. Cord systolic to diastolic ratio measures 3.4. Normal appearance of
the diaphragm.
Biparietal diameter 8.3 centimeters 33 weeks 2 days

Head circumference 31.1 centimeters 34 weeks 6 days

Abdominal circumference 31.4 centimeters 35 weeks 3 days

Femur length 6.5 centimeters 33 weeks 4 days
The HC to AC ratio measures 0.99. The estimated fetal weight is 5556 grams +/-350 9 grams
correlating with 76th percentile weight.

IMPRESSION
1. Single live intrauterine pregnancy with estimated gestational age by ultrasound of 34 weeks 2
days. The LMP gestational age is 33 weeks 3 days.
2. Biophysical profile score of [DATE].

Tech Notes:

GDM;

## 2022-05-29 ENCOUNTER — Encounter: Admit: 2022-05-29 | Discharge: 2022-05-29

## 2022-11-20 ENCOUNTER — Encounter: Admit: 2022-11-20 | Discharge: 2022-11-20

## 2023-04-04 ENCOUNTER — Encounter: Admit: 2023-04-04 | Discharge: 2023-04-04
